# Patient Record
Sex: Female | Born: 2000 | Race: White | Hispanic: No | Marital: Single | Smoking: Never smoker
Health system: Southern US, Community
[De-identification: ages and names within clinical notes are randomized; demographics above are authoritative.]

## PROBLEM LIST (undated history)

## (undated) DIAGNOSIS — G43909 Migraine, unspecified, not intractable, without status migrainosus: Secondary | ICD-10-CM

## (undated) DIAGNOSIS — J45909 Unspecified asthma, uncomplicated: Secondary | ICD-10-CM

## (undated) DIAGNOSIS — F909 Attention-deficit hyperactivity disorder, unspecified type: Secondary | ICD-10-CM

## (undated) HISTORY — DX: Migraine, unspecified, not intractable, without status migrainosus: G43.909

## (undated) HISTORY — DX: Unspecified asthma, uncomplicated: J45.909

## (undated) HISTORY — DX: Attention-deficit hyperactivity disorder, unspecified type: F90.9

---
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
## 2021-09-18 ENCOUNTER — Other Ambulatory Visit: Payer: Self-pay | Admitting: Obstetrics & Gynecology

## 2021-09-19 NOTE — Telephone Encounter (Signed)
Medication refill request: larin fe  Last AEX:  08/23/20  Next AEX: 10/07/21 Last MMG (if hormonal medication request): n/a Refill authorized: #[id number] with 0 rf pended for today

## 2021-10-07 ENCOUNTER — Ambulatory Visit: Payer: BC Managed Care – PPO | Admitting: Obstetrics & Gynecology

## 2021-10-07 DIAGNOSIS — Z0289 Encounter for other administrative examinations: Secondary | ICD-10-CM

## 2021-11-20 ENCOUNTER — Other Ambulatory Visit: Payer: Self-pay | Admitting: Obstetrics & Gynecology

## 2021-12-07 ENCOUNTER — Telehealth: Payer: Self-pay | Admitting: *Deleted

## 2021-12-07 MED ORDER — LARIN 24 FE 1-20 MG-MCG(24) PO TABS: ORAL_TABLET | ORAL | 0 refills | Status: DC

## 2021-12-07 NOTE — Telephone Encounter (Signed)
Patient called annual exam scheduled on 12/20/21, need refill on BCP. Rx sent.

## 2021-12-20 ENCOUNTER — Encounter: Payer: Self-pay | Admitting: Obstetrics & Gynecology

## 2021-12-20 ENCOUNTER — Ambulatory Visit (INDEPENDENT_AMBULATORY_CARE_PROVIDER_SITE_OTHER): Payer: BC Managed Care – PPO | Admitting: Obstetrics & Gynecology

## 2021-12-20 ENCOUNTER — Other Ambulatory Visit: Payer: Self-pay

## 2021-12-20 VITALS — BP 114/70 | HR 80 | Resp 16 | Ht 61.25 in | Wt 213.0 lb

## 2021-12-20 DIAGNOSIS — E6609 Other obesity due to excess calories: Secondary | ICD-10-CM

## 2021-12-20 DIAGNOSIS — Z3041 Encounter for surveillance of contraceptive pills: Secondary | ICD-10-CM

## 2021-12-20 DIAGNOSIS — Z6839 Body mass index (BMI) 39.0-39.9, adult: Secondary | ICD-10-CM | POA: Diagnosis not present

## 2021-12-20 DIAGNOSIS — Z01419 Encounter for gynecological examination (general) (routine) without abnormal findings: Secondary | ICD-10-CM

## 2021-12-20 MED ORDER — LARIN 24 FE 1-20 MG-MCG(24) PO TABS: ORAL_TABLET | ORAL | 5 refills | Status: AC

## 2021-12-20 NOTE — Progress Notes (Signed)
? ? ?Jasmin Chavez [birth year] [id number] ? ? ?History:    21 y.o. G0 Single.  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  ?RP:  Established patient presenting for annual gyn exam  ?  ?HPI:  Well on [place] birth control pill continuously, but ran out of pills 2 weeks ago.  Had a very light period when stopped the pills.  Would like to restart.  No breakthrough bleeding.  No pelvic pain. [place].  Urine and bowel movements normal.  Breasts normal.  Body mass index increased to 39.92.  But lost about 10 Lbs in the last 2 weeks, back to fitness with a trainer.  On a low calorie/carb diet.   ?  ?Past medical history,surgical history, family history and social history were all reviewed and documented in the EPIC chart. ? ?Gynecologic History ?Patient's last menstrual period was 12/06/2021. ? ?Obstetric History ?OB History  ?Gravida Para Term Preterm AB Living  ?0 0 0 0 0 0  ?SAB IAB Ectopic Multiple Live Births  ?0 0 0 0 0  ? ? ? ?ROS: A ROS was performed and pertinent positives and negatives are included in the history. ? GENERAL: No fevers or chills. HEENT: No change in vision, no earache, sore throat or sinus congestion. NECK: No pain or stiffness. CARDIOVASCULAR: No chest pain or pressure. No palpitations. PULMONARY: No shortness of breath, cough or wheeze. GASTROINTESTINAL: No abdominal pain, nausea, vomiting or diarrhea, melena or bright red blood per rectum. GENITOURINARY: No urinary frequency, urgency, hesitancy or dysuria. MUSCULOSKELETAL: No joint or muscle pain, no back pain, no recent trauma. DERMATOLOGIC: No rash, no itching, no lesions. ENDOCRINE: No polyuria, polydipsia, no heat or cold intolerance. No recent change in weight. HEMATOLOGICAL: No anemia or easy bruising or bleeding. NEUROLOGIC: No headache, seizures, numbness, tingling or weakness. PSYCHIATRIC: No depression, no loss of interest in normal activity or change in sleep pattern.  ?  ? ?Exam: ? ? ?BP 114/70   Pulse 80   Resp 16   Ht 5' 1.25" (1.556 m)    Wt 213 lb (96.6 kg)   LMP 12/06/2021 Comment: ran out of birth control 2 weeks ago  BMI 39.92 kg/m?  ? ?Body mass index is 39.92 kg/m?. ? ?General appearance : Well developed well nourished female. No acute distress ?HEENT: Eyes: no retinal hemorrhage or exudates,  Neck supple, trachea midline, no carotid bruits, no thyroidmegaly ?Lungs: Clear to auscultation, no rhonchi or wheezes, or rib retractions  ?Heart: Regular rate and rhythm, no murmurs or gallops ?Breast:Examined in sitting and supine position were symmetrical in appearance, no palpable masses or tenderness,  no skin retraction, no nipple inversion, no nipple discharge, no skin discoloration, no axillary or supraclavicular lymphadenopathy ?Abdomen: no palpable masses or tenderness, no rebound or guarding ?Extremities: no edema or skin discoloration or tenderness ? ?Pelvic: Deferred re Virgin ? ? ?Assessment/Plan:  21 y.o. female for annual exam  ? ?1. Well female exam with routine gynecological exam ?Well on [place] birth control pill continuously, but ran out of pills 2 weeks ago.  Had a very light period when stopped the pills.  Would like to restart.  No breakthrough bleeding.  No pelvic pain. [place]. Urine and bowel movements normal.  Breasts normal.  Body mass index increased to 39.92.  But lost about 10 Lbs in the last 2 weeks, back to fitness with a trainer.  On a low calorie/carb diet.   ? ?2. Encounter for surveillance of contraceptive pills ?Well on [place] birth control  pill continuously, but ran out of pills 2 weeks ago.  Had a very light period when stopped the pills.  Would like to restart.  No breakthrough bleeding.  No pelvic pain. [place]. No CI to restart on BCPs.  Prescription sent to pharmacy. ? ?3. Class 2 obesity due to excess calories without serious comorbidity with body mass index (BMI) of 39.0 to 39.9 in adult ?Body mass index increased to 39.92.  But lost about 10 Lbs in the last 2 weeks, back to fitness with a trainer.  On a low  calorie/carb diet.   ? ?Other orders ?- Triamcinolone Acetonide (TRIAMCINOLONE 0.1 % CREAM : EUCERIN) CREA; APPLY TO AFFECTED AREA(S) TWO TIMES A DAY AS NEEDED FOR MODERATE TO SEVERE ECZEMA (AVOID APPLYING TO FACE) ?- methylphenidate (RITALIN LA) 30 MG 24 hr capsule; Take by mouth. ?- Chlorpheniramine Maleate (ALLERGY PO); Take by mouth. ?- Norethindrone Acetate-Ethinyl Estrad-FE (LARIN 24 FE) 1-20 MG-MCG(24) tablet; TAKE ONE TABLET BY MOUTH ONE TIME DAILY CONTINUOUSLY  ? ?Princess Bruins MD, 12:04 PM 12/20/2021 ? ?  ?

## 2023-02-27 ENCOUNTER — Other Ambulatory Visit: Payer: Self-pay | Admitting: Obstetrics & Gynecology

## 2023-02-27 DIAGNOSIS — Z01419 Encounter for gynecological examination (general) (routine) without abnormal findings: Secondary | ICD-10-CM

## 2023-02-27 DIAGNOSIS — Z3041 Encounter for surveillance of contraceptive pills: Secondary | ICD-10-CM

## 2023-02-28 NOTE — Telephone Encounter (Signed)
Med refill request: Last AEX: 12/20/21 Next AEX: no appointment scheduled Last MMG (if hormonal med) Refill authorized: Please Advise?

## 2023-05-01 ENCOUNTER — Other Ambulatory Visit: Payer: Self-pay | Admitting: Obstetrics & Gynecology

## 2023-05-02 NOTE — Telephone Encounter (Signed)
Medication refill request: [email or web address] Last AEX:  12-20-21 Next AEX: not scheduled Last MMG (if hormonal medication request): none Refill authorized: rx denied 02-27-23 by Dr Seymour Bars. This Rx denied also. Pharmacy note placed for patient to call to schedule appt.
